# Patient Record
Sex: Male | Born: 2000 | Race: White | Hispanic: No | Marital: Single | Smoking: Former smoker
Health system: Southern US, Community
[De-identification: ages and names within clinical notes are randomized; demographics above are authoritative.]

## PROBLEM LIST (undated history)

## (undated) DIAGNOSIS — F419 Anxiety disorder, unspecified: Secondary | ICD-10-CM

## (undated) HISTORY — PX: APPENDECTOMY: SHX54

---
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
## 2022-07-27 DIAGNOSIS — Z20822 Contact with and (suspected) exposure to covid-19: Secondary | ICD-10-CM | POA: Diagnosis not present

## 2022-07-27 DIAGNOSIS — J069 Acute upper respiratory infection, unspecified: Secondary | ICD-10-CM | POA: Diagnosis not present

## 2022-07-27 DIAGNOSIS — J029 Acute pharyngitis, unspecified: Secondary | ICD-10-CM | POA: Diagnosis not present

## 2022-08-03 DIAGNOSIS — R059 Cough, unspecified: Secondary | ICD-10-CM | POA: Diagnosis not present

## 2022-08-03 DIAGNOSIS — J069 Acute upper respiratory infection, unspecified: Secondary | ICD-10-CM | POA: Diagnosis not present

## 2022-08-04 DIAGNOSIS — Z5321 Procedure and treatment not carried out due to patient leaving prior to being seen by health care provider: Secondary | ICD-10-CM | POA: Diagnosis not present

## 2022-08-04 DIAGNOSIS — R059 Cough, unspecified: Secondary | ICD-10-CM | POA: Diagnosis not present

## 2022-08-04 DIAGNOSIS — I4510 Unspecified right bundle-branch block: Secondary | ICD-10-CM | POA: Diagnosis not present

## 2022-08-05 DIAGNOSIS — B9689 Other specified bacterial agents as the cause of diseases classified elsewhere: Secondary | ICD-10-CM | POA: Diagnosis not present

## 2022-08-05 DIAGNOSIS — J208 Acute bronchitis due to other specified organisms: Secondary | ICD-10-CM | POA: Diagnosis not present

## 2022-08-05 DIAGNOSIS — R059 Cough, unspecified: Secondary | ICD-10-CM | POA: Diagnosis not present

## 2022-08-31 DIAGNOSIS — S51801A Unspecified open wound of right forearm, initial encounter: Secondary | ICD-10-CM | POA: Diagnosis not present

## 2022-08-31 DIAGNOSIS — J069 Acute upper respiratory infection, unspecified: Secondary | ICD-10-CM | POA: Diagnosis not present

## 2022-09-03 DIAGNOSIS — J00 Acute nasopharyngitis [common cold]: Secondary | ICD-10-CM | POA: Diagnosis not present

## 2022-09-14 DIAGNOSIS — Z20822 Contact with and (suspected) exposure to covid-19: Secondary | ICD-10-CM | POA: Diagnosis not present

## 2022-09-14 DIAGNOSIS — J101 Influenza due to other identified influenza virus with other respiratory manifestations: Secondary | ICD-10-CM | POA: Diagnosis not present

## 2022-09-17 DIAGNOSIS — R059 Cough, unspecified: Secondary | ICD-10-CM | POA: Diagnosis not present

## 2022-09-17 DIAGNOSIS — J101 Influenza due to other identified influenza virus with other respiratory manifestations: Secondary | ICD-10-CM | POA: Diagnosis not present

## 2022-09-20 ENCOUNTER — Other Ambulatory Visit: Payer: Self-pay

## 2022-09-20 ENCOUNTER — Emergency Department (HOSPITAL_BASED_OUTPATIENT_CLINIC_OR_DEPARTMENT_OTHER): Payer: BC Managed Care – PPO | Admitting: Radiology

## 2022-09-20 ENCOUNTER — Other Ambulatory Visit (HOSPITAL_BASED_OUTPATIENT_CLINIC_OR_DEPARTMENT_OTHER): Payer: BC Managed Care – PPO | Admitting: Radiology

## 2022-09-20 ENCOUNTER — Encounter (HOSPITAL_COMMUNITY): Payer: Self-pay

## 2022-09-20 ENCOUNTER — Emergency Department (HOSPITAL_BASED_OUTPATIENT_CLINIC_OR_DEPARTMENT_OTHER): Payer: BC Managed Care – PPO

## 2022-09-20 ENCOUNTER — Emergency Department (HOSPITAL_BASED_OUTPATIENT_CLINIC_OR_DEPARTMENT_OTHER)
Admission: EM | Admit: 2022-09-20 | Discharge: 2022-09-20 | Disposition: A | Discharge status: Home / Self Care | Payer: BC Managed Care – PPO | Attending: Emergency Medicine | Admitting: Emergency Medicine

## 2022-09-20 DIAGNOSIS — R9431 Abnormal electrocardiogram [ECG] [EKG]: Secondary | ICD-10-CM | POA: Diagnosis not present

## 2022-09-20 DIAGNOSIS — R1084 Generalized abdominal pain: Secondary | ICD-10-CM | POA: Insufficient documentation

## 2022-09-20 DIAGNOSIS — R1110 Vomiting, unspecified: Secondary | ICD-10-CM | POA: Diagnosis not present

## 2022-09-20 DIAGNOSIS — D649 Anemia, unspecified: Secondary | ICD-10-CM | POA: Insufficient documentation

## 2022-09-20 DIAGNOSIS — R509 Fever, unspecified: Secondary | ICD-10-CM | POA: Diagnosis not present

## 2022-09-20 DIAGNOSIS — J020 Streptococcal pharyngitis: Secondary | ICD-10-CM | POA: Insufficient documentation

## 2022-09-20 DIAGNOSIS — R7401 Elevation of levels of liver transaminase levels: Secondary | ICD-10-CM | POA: Diagnosis not present

## 2022-09-20 DIAGNOSIS — R109 Unspecified abdominal pain: Secondary | ICD-10-CM | POA: Diagnosis not present

## 2022-09-20 DIAGNOSIS — R059 Cough, unspecified: Secondary | ICD-10-CM | POA: Diagnosis not present

## 2022-09-20 DIAGNOSIS — Z20822 Contact with and (suspected) exposure to covid-19: Secondary | ICD-10-CM | POA: Insufficient documentation

## 2022-09-20 LAB — URINALYSIS, ROUTINE W REFLEX MICROSCOPIC
Bilirubin Urine: NEGATIVE
Glucose, UA: NEGATIVE mg/dL
Hgb urine dipstick: NEGATIVE
Ketones, ur: NEGATIVE mg/dL
Leukocytes,Ua: NEGATIVE
Nitrite: NEGATIVE
Protein, ur: 30 mg/dL — AB
Specific Gravity, Urine: 1.025 (ref 1.005–1.030)
pH: 6 (ref 5.0–8.0)

## 2022-09-20 LAB — CBC WITH DIFFERENTIAL/PLATELET
Abs Immature Granulocytes: 0.02 10*3/uL (ref 0.00–0.07)
Basophils Absolute: 0 10*3/uL (ref 0.0–0.1)
Basophils Relative: 1 %
Eosinophils Absolute: 0 10*3/uL (ref 0.0–0.5)
Eosinophils Relative: 0 %
HCT: 37 % — ABNORMAL LOW (ref 39.0–52.0)
Hemoglobin: 12.9 g/dL — ABNORMAL LOW (ref 13.0–17.0)
Immature Granulocytes: 1 %
Lymphocytes Relative: 31 %
Lymphs Abs: 1.3 10*3/uL (ref 0.7–4.0)
MCH: 33.1 pg (ref 26.0–34.0)
MCHC: 34.9 g/dL (ref 30.0–36.0)
MCV: 94.9 fL (ref 80.0–100.0)
Monocytes Absolute: 0.4 10*3/uL (ref 0.1–1.0)
Monocytes Relative: 9 %
Neutro Abs: 2.5 10*3/uL (ref 1.7–7.7)
Neutrophils Relative %: 58 %
Platelets: 205 10*3/uL (ref 150–400)
RBC: 3.9 MIL/uL — ABNORMAL LOW (ref 4.22–5.81)
RDW: 12.2 % (ref 11.5–15.5)
Smear Review: NORMAL
WBC: 4.3 10*3/uL (ref 4.0–10.5)
nRBC: 0 % (ref 0.0–0.2)

## 2022-09-20 LAB — COMPREHENSIVE METABOLIC PANEL
ALT: 129 U/L — ABNORMAL HIGH (ref 0–44)
AST: 108 U/L — ABNORMAL HIGH (ref 15–41)
Albumin: 3.9 g/dL (ref 3.5–5.0)
Alkaline Phosphatase: 53 U/L (ref 38–126)
Anion gap: 8 (ref 5–15)
BUN: 11 mg/dL (ref 6–20)
CO2: 27 mmol/L (ref 22–32)
Calcium: 9 mg/dL (ref 8.9–10.3)
Chloride: 96 mmol/L — ABNORMAL LOW (ref 98–111)
Creatinine, Ser: 0.85 mg/dL (ref 0.61–1.24)
GFR, Estimated: >60 mL/min (ref >60)
Glucose, Bld: 90 mg/dL (ref 70–99)
Potassium: 4.3 mmol/L (ref 3.5–5.1)
Sodium: 131 mmol/L — ABNORMAL LOW (ref 135–145)
Total Bilirubin: 0.2 mg/dL — ABNORMAL LOW (ref 0.3–1.2)
Total Protein: 7.2 g/dL (ref 6.5–8.1)

## 2022-09-20 LAB — RAPID HIV SCREEN (HIV 1/2 AB+AG)
HIV 1/2 Antibodies: NONREACTIVE
HIV-1 P24 Antigen - HIV24: NONREACTIVE

## 2022-09-20 LAB — LIPASE, BLOOD: Lipase: 16 U/L (ref 11–51)

## 2022-09-20 LAB — MONONUCLEOSIS SCREEN: Mono Screen: NEGATIVE

## 2022-09-20 LAB — SARS CORONAVIRUS 2 BY RT PCR: SARS Coronavirus 2 by RT PCR: NEGATIVE

## 2022-09-20 LAB — GROUP A STREP BY PCR: Group A Strep by PCR: DETECTED — AB

## 2022-09-20 MED ORDER — IOHEXOL 300 MG/ML  SOLN
100.0000 mL | Freq: Once | INTRAMUSCULAR | Status: AC | PRN
  Administered 2022-09-20: 80 mL via INTRAVENOUS

## 2022-09-20 MED ORDER — SODIUM CHLORIDE 0.9 % IV BOLUS
1000.0000 mL | Freq: Once | INTRAVENOUS | Status: AC
Start: 2022-09-20 — End: 2022-09-20
  Administered 2022-09-20: 1000 mL via INTRAVENOUS

## 2022-09-20 MED ORDER — PENICILLIN G BENZATHINE 1200000 UNIT/2ML IM SUSY
1.2000 10*6.[IU] | PREFILLED_SYRINGE | Freq: Once | INTRAMUSCULAR | Status: AC
  Administered 2022-09-20: 1.2 10*6.[IU] via INTRAMUSCULAR
  Filled 2022-09-20: qty 2

## 2022-09-20 MED ORDER — IBUPROFEN 800 MG PO TABS
800.0000 mg | ORAL_TABLET | Freq: Once | ORAL | Status: AC
  Administered 2022-09-20: 800 mg via ORAL
  Filled 2022-09-20: qty 1

## 2022-09-20 NOTE — ED Triage Notes (Signed)
Patient here POV from Home.  Endorses having Multiple URI Symptoms such as Fever, Fatigue, Productive Cough, Aches, Emesis, Intermittent Sore Throat.   This has been ongoing Intermittently for 2 Months. Has visited several different providers regarding ailments. Diagnosed with Flu 4-5 Days ago. Has been taking Antibiotics for Possible Bronchitis.  NAD Noted during Triage. A&Ox4. GCS 15. Ambulatory.

## 2022-09-20 NOTE — Discharge Instructions (Addendum)
Thank you for allowing me to be a part of your care today.  You were evaluated in the ER for continued high fevers and sore throat.  You are strep positive.  We gave you a shot of penicillin while you were here.  Please continue to take your Augmentin and take it to completion.  Laboratory work-up is reassuring.  You do not require emergent intervention or hospitalization at this time.  You did have laboratory findings that are consistent with mono, despite your Monospot being negative.  Your hepatitis panel and EBV panel are still pending.  You will be notified of these results when they become available.  You may continue to alternate taking Tylenol and ibuprofen every 3-4 hours as needed for fever control.  If you develop fever that is not responsive to treatment, please return to the ER for further evaluation and management.  Please follow-up with your primary care provider later this week for reevaluation of your symptoms and if you continue to have respiratory or other infections.  Return to the ER if you develop new or worsening symptoms.

## 2022-09-20 NOTE — ED Provider Notes (Cosign Needed Addendum)
[facility] EMERGENCY DEPT Provider Note   CSN: [id number] Arrival date & time: 09/20/22  1214     History  Chief Complaint  Patient presents with   Fever    Seth Molina is a 21 y.o. male presents to the ER with complaint of fever, fatigue, productive cough, body aches, emesis, intermittent sore throat.  Patient has had multiple URI symptoms and fevers since the beginning of October.  He has been evaluated multiple times in urgent care and treated with different antibiotics.  He tested positive for influenza type B 4-5 days ago.  He was also placed on Augmentin for suspected bronchitis.  His fever is responsive to antipyretics.  He also reports some abdominal pain.  Denies shortness of breath, chest pain, trouble swallowing, diarrhea, weakness, syncope, lightheadedness, dysuria, rash.        Home Medications Prior to Admission medications   Medication Sig Start Date End Date Taking? Authorizing Provider  acetaminophen (TYLENOL) 325 MG tablet Take 2 tablets (650 mg total) by mouth every 6 (six) hours. Patient taking differently: Take 650 mg by mouth every 6 (six) hours as needed for mild pain or headache. 01/20/19   Marrion Coy, MD  diphenhydrAMINE (BENADRYL) 25 mg capsule Take 25 mg by mouth every 6 (six) hours as needed (for restless legs).     [provider]  FLUoxetine (PROZAC) 20 MG capsule Take 20 mg by mouth at bedtime.  05/09/20   [provider]  hydrOXYzine (ATARAX/VISTARIL) 25 MG tablet Take 1 tablet (25 mg total) by mouth 2 (two) times daily as needed for anxiety. 01/20/19   Marrion Coy, MD  ibuprofen (ADVIL,MOTRIN) 200 MG tablet Take 400 mg by mouth every 6 (six) hours as needed for headache or mild pain.     [provider]  ISOtretinoin (ACCUTANE PO) Take by mouth.    [provider]  ketoconazole (NIZORAL) 2 % shampoo Apply 1 application topically 2 (two) times a week.    [provider]  mupirocin ointment  (BACTROBAN) 2 % Apply 1 application topically See admin instructions. Apply to affected areas 2-3 times as directed 05/10/20   [provider]  simethicone (MYLICON) 40 MG/0.6ML drops Take 0.6 mLs (40 mg total) by mouth 2 (two) times daily as needed for flatulence. Patient not taking: Reported on 05/25/2020 01/20/19   Leeroy Bock, MD  simethicone (MYLICON) 80 MG chewable tablet Chew 80-160 mg by mouth every 6 (six) hours as needed for flatulence.    [provider]      Allergies    Azithromycin and Tylenol [acetaminophen]    Review of Systems   Review of Systems  Constitutional:  Positive for appetite change, chills and fever.  HENT:  Positive for sore throat. Negative for trouble swallowing.   Respiratory:  Positive for cough. Negative for shortness of breath.   Cardiovascular:  Negative for chest pain.  Gastrointestinal:  Positive for abdominal pain, nausea and vomiting. Negative for diarrhea.  Genitourinary:  Negative for dysuria.  Musculoskeletal:  Positive for myalgias.  Skin:  Negative for rash.  Neurological:  Negative for dizziness, syncope, weakness and light-headedness.    Physical Exam Updated Vital Signs BP 97/61   Pulse 89   Temp 100.3 F (37.9 C) (Oral)   Resp (!) 21   Ht 5\' 9"  (1.753 m)   Wt 62.4 kg   SpO2 96%   BMI 20.32 kg/m  Physical Exam Vitals and nursing note reviewed.  Constitutional:  General: He is not in acute distress.    Appearance: He is not ill-appearing.  HENT:     Mouth/Throat:     Mouth: Mucous membranes are moist.     Pharynx: Pharyngeal swelling and posterior oropharyngeal erythema present. No uvula swelling.  Cardiovascular:     Rate and Rhythm: Normal rate and regular rhythm.     Pulses: Normal pulses.     Heart sounds: Normal heart sounds.  Pulmonary:     Effort: Pulmonary effort is normal. No respiratory distress.     Breath sounds: Normal breath sounds and air entry. No wheezing or rhonchi.  Abdominal:      General: Abdomen is flat. Bowel sounds are normal. There is no distension.     Palpations: Abdomen is soft.     Tenderness: There is generalized abdominal tenderness.     Comments: Generalized abdominal tenderness with increased tenderness in the epigastric and right upper quadrant.  Lymphadenopathy:     Cervical: No cervical adenopathy.  Skin:    General: Skin is warm and dry.     Capillary Refill: Capillary refill takes less than 2 seconds.     Coloration: Skin is pale.  Neurological:     Mental Status: He is alert. Mental status is at baseline.  Psychiatric:        Mood and Affect: Mood normal.        Behavior: Behavior normal.     ED Results / Procedures / Treatments   Labs (all labs ordered are listed, but only abnormal results are displayed) Labs Reviewed  GROUP A STREP BY PCR - Abnormal; Notable for the following components:      Result Value   Group A Strep by PCR DETECTED (*)    All other components within normal limits  CBC WITH DIFFERENTIAL/PLATELET - Abnormal; Notable for the following components:   RBC 3.90 (*)    Hemoglobin 12.9 (*)    HCT 37.0 (*)    All other components within normal limits  COMPREHENSIVE METABOLIC PANEL - Abnormal; Notable for the following components:   Sodium 131 (*)    Chloride 96 (*)    AST 108 (*)    ALT 129 (*)    Total Bilirubin 0.2 (*)    All other components within normal limits  URINALYSIS, ROUTINE W REFLEX MICROSCOPIC - Abnormal; Notable for the following components:   Protein, ur 30 (*)    All other components within normal limits  SARS CORONAVIRUS 2 BY RT PCR  MONONUCLEOSIS SCREEN  LIPASE, BLOOD  RAPID HIV SCREEN (HIV 1/2 AB+AG)  EPSTEIN-BARR VIRUS (EBV) ANTIBODY PROFILE  HEPATITIS PANEL, ACUTE    EKG EKG Interpretation  Date/Time:  Sunday September 20 2022 15:47:29 EST Ventricular Rate:  86 PR Interval:  134 QRS Duration: 108 QT Interval:  343 QTC Calculation: 411 R Axis:   81 Text Interpretation: Sinus  rhythm Probable left ventricular hypertrophy No significant change was found Confirmed by Glynn Octave [id number]) on 09/20/2022 6:54:13 PM  Radiology DG Chest 2 View  Result Date: 09/20/2022 CLINICAL DATA:  Persistent cough. EXAM: CHEST - 2 VIEW COMPARISON:  None Available. FINDINGS: Normal mediastinum and cardiac silhouette. Normal pulmonary vasculature. No evidence of effusion, infiltrate, or pneumothorax. No acute bony abnormality. IMPRESSION: No acute cardiopulmonary process. Electronically Signed   By: Genevive Bi M.D.   On: 09/20/2022 17:17    Procedures Procedures    Medications Ordered in ED Medications  sodium chloride 0.9 % bolus 1,000 mL (1,000 mLs Intravenous  New Bag/Given 09/20/22 1750)  ibuprofen (ADVIL) tablet 800 mg (800 mg Oral Given 09/20/22 1856)  iohexol (OMNIPAQUE) 300 MG/ML solution 100 mL (80 mLs Intravenous Contrast Given 09/20/22 2015)  penicillin g benzathine (BICILLIN LA) 1200000 UNIT/2ML injection 1.2 Million Units (1.2 Million Units Intramuscular Given 09/20/22 2140)    ED Course/ Medical Decision Making/ A&P                           Medical Decision Making Amount and/or Complexity of Data Reviewed Labs: ordered. Radiology: ordered.  Risk Prescription drug management.   This patient presents to the ED with chief complaint(s) of URI symptoms, sore throat, and persistent high fevers with pertinent past medical history of same symptoms since 08/03/2022 .The complaint involves an extensive differential diagnosis and also carries with it a high risk of complications and morbidity.    The differential diagnosis includes COVID, strep pharyngitis, EBV, acute bronchitis, pneumonia  The initial plan is to obtain baseline labs including CBC, CMP, swab for COVID, strep swab, UA, mono, lipase  Additional history obtained: Additional history obtained from family, patient's mother is with him at bedside Records reviewed Primary Care Documents  Initial  Assessment:   On exam, patient appears ill and is pale.  Skin is warm and dry, nondiaphoretic.  Heart rate and rhythm are normal.  Lungs are clear to auscultation bilaterally.  He does have a dry nonproductive cough.  Abdomen is soft and generally tender.  Posterior oropharynx is erythematous with mild swelling, no obvious exudate.  No appreciable cervical lymphadenopathy.  He is able to swallow and speak without difficulty.  Independent ECG/labs interpretation:  The following labs were independently interpreted:  CBC reveals borderline anemia, no leukocytosis.  He does have occasional atypical lymphocytes seen. Metabolic panel significant for elevated AST and ALT.  Sodium and chloride abnormal. Mononuclear screen was negative. UA is otherwise unremarkable, some protein, no evidence of UTI, glucosuria. COVID was negative Group A strep was positive Lipase normal  Additional testing was ordered including hepatitis panel, EBV antibody profile, HIV screen.   Independent visualization and interpretation of imaging: I independently visualized the following imaging with scope of interpretation limited to determining acute life threatening conditions related to emergency care: Chest x-ray, which revealed active cardiopulmonary disease.  I agree with radiologist interpretation.  Based on patient's vague abdominal pain, will obtain CT abdomen pelvis with contrast.  CT abdomen pelvis revealed a Extina millimeter radiopaque object in the ileus.  Likely to be an incidental finding.  Scan is otherwise unremarkable.  Treatment and Reassessment: We will treat patient with IV fluids and manage his fever with ibuprofen.  He reports feeling somewhat better following treatment.  His fever is being well managed and defervesced.  He continued to have strep symptoms despite being on Augmentin.  We will give him a Bicillin shot and have him continue taking his Augmentin at home.  Consulted with general surgery, Ivar Drape, regarding CT findings.  There is no evidence of perforation or inflammation to the intestine where the radiopaque object was found.  This is likely to be an incidental finding and object will likely pass on its own.  They state that it is likely unrelated to his fevers and presentation in ER today.  He is tolerating eating and drinking without nausea, vomiting, or worsening of symptoms.  Disposition:   At this time, patient does not have any condition that requires hospitalization or emergent intervention.  He  has improved with IV fluid treatment and his fever is responsive to ibuprofen.  He does not have significant leukocytosis and is not septic.  Discussed laboratory work-up and findings with patient and mother at bedside.  He does not recall any instance where he would have ingested a foreign object.  He is not complaining of significant right lower quadrant abdominal pain and has been having bowel movements without issue.  Recommended patient follow-up with primary care later this week for reevaluation of his symptoms.  Advised to continue to treat fevers with Advil and Tylenol alternating every 3-4 hours.  Patient and mother verbally expressed their understanding and agree with plan of care and discharge home.  Return precautions were given.          Final Clinical Impression(s) / ED Diagnoses Final diagnoses:  Strep pharyngitis    Rx / DC Orders ED Discharge Orders     None         Lenard SimmerClark, Yona Stansbury R, PA 09/20/22 2312    Lenard SimmerClark, Joeangel Jeanpaul R, [place] 09/20/22 2314    Glynn Octaveancour, Stephen, MD 09/22/22 0111

## 2022-09-21 LAB — HEPATITIS PANEL, ACUTE
HCV Ab: NONREACTIVE
Hep A IgM: NONREACTIVE
Hep B C IgM: NONREACTIVE
Hepatitis B Surface Ag: NONREACTIVE

## 2022-09-22 LAB — EPSTEIN-BARR VIRUS (EBV) ANTIBODY PROFILE
EBV NA IgG: <18 U/mL (ref 0.0–17.9)
EBV VCA IgG: <18 U/mL (ref 0.0–17.9)
EBV VCA IgM: <36 U/mL (ref 0.0–35.9)

## 2022-09-25 DIAGNOSIS — Z1321 Encounter for screening for nutritional disorder: Secondary | ICD-10-CM | POA: Diagnosis not present

## 2022-09-25 DIAGNOSIS — B349 Viral infection, unspecified: Secondary | ICD-10-CM | POA: Diagnosis not present

## 2022-09-25 DIAGNOSIS — J020 Streptococcal pharyngitis: Secondary | ICD-10-CM | POA: Diagnosis not present

## 2022-09-25 DIAGNOSIS — R7989 Other specified abnormal findings of blood chemistry: Secondary | ICD-10-CM | POA: Diagnosis not present

## 2022-09-25 DIAGNOSIS — R051 Acute cough: Secondary | ICD-10-CM | POA: Diagnosis not present

## 2022-10-02 DIAGNOSIS — R748 Abnormal levels of other serum enzymes: Secondary | ICD-10-CM | POA: Diagnosis not present

## 2022-10-09 DIAGNOSIS — H5213 Myopia, bilateral: Secondary | ICD-10-CM | POA: Diagnosis not present

## 2022-10-22 DIAGNOSIS — R7989 Other specified abnormal findings of blood chemistry: Secondary | ICD-10-CM | POA: Diagnosis not present

## 2022-10-22 DIAGNOSIS — R509 Fever, unspecified: Secondary | ICD-10-CM | POA: Diagnosis not present

## 2022-11-16 DIAGNOSIS — R509 Fever, unspecified: Secondary | ICD-10-CM | POA: Diagnosis not present

## 2022-11-16 DIAGNOSIS — R7989 Other specified abnormal findings of blood chemistry: Secondary | ICD-10-CM | POA: Diagnosis not present

## 2022-12-01 DIAGNOSIS — R7989 Other specified abnormal findings of blood chemistry: Secondary | ICD-10-CM | POA: Diagnosis not present

## 2022-12-01 DIAGNOSIS — R590 Localized enlarged lymph nodes: Secondary | ICD-10-CM | POA: Diagnosis not present

## 2022-12-10 DIAGNOSIS — L608 Other nail disorders: Secondary | ICD-10-CM | POA: Diagnosis not present

## 2022-12-10 DIAGNOSIS — Z1330 Encounter for screening examination for mental health and behavioral disorders, unspecified: Secondary | ICD-10-CM | POA: Diagnosis not present

## 2022-12-31 DIAGNOSIS — E785 Hyperlipidemia, unspecified: Secondary | ICD-10-CM | POA: Diagnosis not present

## 2022-12-31 DIAGNOSIS — T5691XA Toxic effect of unspecified metal, accidental (unintentional), initial encounter: Secondary | ICD-10-CM | POA: Diagnosis not present

## 2022-12-31 DIAGNOSIS — Z0000 Encounter for general adult medical examination without abnormal findings: Secondary | ICD-10-CM | POA: Diagnosis not present

## 2022-12-31 DIAGNOSIS — R5382 Chronic fatigue, unspecified: Secondary | ICD-10-CM | POA: Diagnosis not present

## 2022-12-31 DIAGNOSIS — R739 Hyperglycemia, unspecified: Secondary | ICD-10-CM | POA: Diagnosis not present

## 2022-12-31 DIAGNOSIS — F411 Generalized anxiety disorder: Secondary | ICD-10-CM | POA: Diagnosis not present

## 2022-12-31 DIAGNOSIS — E559 Vitamin D deficiency, unspecified: Secondary | ICD-10-CM | POA: Diagnosis not present

## 2022-12-31 DIAGNOSIS — E611 Iron deficiency: Secondary | ICD-10-CM | POA: Diagnosis not present

## 2023-01-12 DIAGNOSIS — Z202 Contact with and (suspected) exposure to infections with a predominantly sexual mode of transmission: Secondary | ICD-10-CM | POA: Diagnosis not present

## 2023-01-30 DIAGNOSIS — R051 Acute cough: Secondary | ICD-10-CM | POA: Diagnosis not present

## 2023-01-30 DIAGNOSIS — R0981 Nasal congestion: Secondary | ICD-10-CM | POA: Diagnosis not present

## 2023-01-30 DIAGNOSIS — J00 Acute nasopharyngitis [common cold]: Secondary | ICD-10-CM | POA: Diagnosis not present

## 2023-01-30 DIAGNOSIS — J029 Acute pharyngitis, unspecified: Secondary | ICD-10-CM | POA: Diagnosis not present

## 2023-07-06 DIAGNOSIS — N5089 Other specified disorders of the male genital organs: Secondary | ICD-10-CM | POA: Diagnosis not present

## 2023-07-22 DIAGNOSIS — N509 Disorder of male genital organs, unspecified: Secondary | ICD-10-CM | POA: Diagnosis not present

## 2023-07-22 DIAGNOSIS — N433 Hydrocele, unspecified: Secondary | ICD-10-CM | POA: Diagnosis not present

## 2023-09-14 DIAGNOSIS — R0981 Nasal congestion: Secondary | ICD-10-CM | POA: Diagnosis not present

## 2023-09-14 DIAGNOSIS — J189 Pneumonia, unspecified organism: Secondary | ICD-10-CM | POA: Diagnosis not present

## 2023-10-02 DIAGNOSIS — R0989 Other specified symptoms and signs involving the circulatory and respiratory systems: Secondary | ICD-10-CM | POA: Diagnosis not present

## 2023-10-02 DIAGNOSIS — R059 Cough, unspecified: Secondary | ICD-10-CM | POA: Diagnosis not present

## 2023-10-02 DIAGNOSIS — J069 Acute upper respiratory infection, unspecified: Secondary | ICD-10-CM | POA: Diagnosis not present

## 2023-10-02 DIAGNOSIS — Z1152 Encounter for screening for COVID-19: Secondary | ICD-10-CM | POA: Diagnosis not present

## 2023-10-02 DIAGNOSIS — J029 Acute pharyngitis, unspecified: Secondary | ICD-10-CM | POA: Diagnosis not present

## 2023-10-05 DIAGNOSIS — B9689 Other specified bacterial agents as the cause of diseases classified elsewhere: Secondary | ICD-10-CM | POA: Diagnosis not present

## 2023-10-05 DIAGNOSIS — F411 Generalized anxiety disorder: Secondary | ICD-10-CM | POA: Diagnosis not present

## 2023-10-05 DIAGNOSIS — J019 Acute sinusitis, unspecified: Secondary | ICD-10-CM | POA: Diagnosis not present

## 2023-12-16 ENCOUNTER — Emergency Department (HOSPITAL_BASED_OUTPATIENT_CLINIC_OR_DEPARTMENT_OTHER): Payer: BC Managed Care – PPO

## 2023-12-16 ENCOUNTER — Other Ambulatory Visit: Payer: Self-pay

## 2023-12-16 ENCOUNTER — Emergency Department (HOSPITAL_BASED_OUTPATIENT_CLINIC_OR_DEPARTMENT_OTHER)
Admission: EM | Admit: 2023-12-16 | Discharge: 2023-12-16 | Disposition: A | Discharge status: Home / Self Care | Payer: BC Managed Care – PPO | Attending: Emergency Medicine | Admitting: Emergency Medicine

## 2023-12-16 ENCOUNTER — Emergency Department (HOSPITAL_BASED_OUTPATIENT_CLINIC_OR_DEPARTMENT_OTHER): Payer: BC Managed Care – PPO | Admitting: Radiology

## 2023-12-16 DIAGNOSIS — S0990XA Unspecified injury of head, initial encounter: Secondary | ICD-10-CM | POA: Insufficient documentation

## 2023-12-16 DIAGNOSIS — R0781 Pleurodynia: Secondary | ICD-10-CM | POA: Diagnosis not present

## 2023-12-16 DIAGNOSIS — S20211A Contusion of right front wall of thorax, initial encounter: Secondary | ICD-10-CM | POA: Diagnosis not present

## 2023-12-16 DIAGNOSIS — R079 Chest pain, unspecified: Secondary | ICD-10-CM | POA: Diagnosis not present

## 2023-12-16 DIAGNOSIS — S299XXA Unspecified injury of thorax, initial encounter: Secondary | ICD-10-CM | POA: Diagnosis not present
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
## 2024-01-07 DIAGNOSIS — E860 Dehydration: Secondary | ICD-10-CM | POA: Diagnosis not present

## 2024-01-07 DIAGNOSIS — F1010 Alcohol abuse, uncomplicated: Secondary | ICD-10-CM | POA: Diagnosis not present

## 2024-01-07 DIAGNOSIS — R112 Nausea with vomiting, unspecified: Secondary | ICD-10-CM | POA: Diagnosis not present

## 2024-01-07 DIAGNOSIS — Z5321 Procedure and treatment not carried out due to patient leaving prior to being seen by health care provider: Secondary | ICD-10-CM | POA: Diagnosis not present

## 2024-01-07 DIAGNOSIS — R824 Acetonuria: Secondary | ICD-10-CM | POA: Diagnosis not present

## 2024-04-17 ENCOUNTER — Ambulatory Visit (INDEPENDENT_AMBULATORY_CARE_PROVIDER_SITE_OTHER): Admitting: Urgent Care

## 2024-04-17 ENCOUNTER — Encounter: Payer: Self-pay | Admitting: Urgent Care

## 2024-04-17 VITALS — BP 117/79 | HR 70 | Temp 97.9°F | Resp 18 | Ht 69.0 in | Wt 137.5 lb

## 2024-04-17 DIAGNOSIS — Z0000 Encounter for general adult medical examination without abnormal findings: Secondary | ICD-10-CM

## 2024-04-17 NOTE — Progress Notes (Signed)
 Annual Wellness Visit     Patient: Seth Molina, Male    DOB: May 06, 2001, 23 y.o.   MRN: [id number]  Subjective  Chief Complaint  Patient presents with   New Patient (Initial Visit)    Est. 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 Doesn't have any paperwork    Seth Molina is a 23 y.o. male who presents today for his Annual Wellness Visit. He reports consuming a low sugar diet. Gym/ health club routine includes mod to heavy weightlifting. And treadmill. Exercises 5 days per week. He generally feels well. He reports sleeping well. He does not have additional problems to discuss today.   HPI  Vision:Within last year  Dentist - sees then twice a year.  Discussed the use of AI scribe software for clinical note transcription with the patient, who gave verbal consent to proceed.  History of Present Illness   Seth Molina is a 23 year old male who presents for an annual physical exam.  He takes fluoxetine 20 mg at night and hydroxyzine  25 mg twice daily as needed, with no current need for refills. He rates his overall health as a 'seven or eight' out of ten, primarily due to an inconsistent sleep schedule. During the summer, he averages eight hours of sleep per night, but during the school year, he only gets four to five hours. He does not have trouble falling asleep.  He exercises five times a week, primarily weightlifting at the gym, and also engages in cardiovascular activities such as using the treadmill machine. He follows a low-sugar diet for health reasons, without any specific dietary restrictions.  He has a history of tobacco use but no longer uses it. He follows up with both a dentist and an eye doctor, with his last eye visit being last summer and a dental visit a few months ago. He is currently in school with two years remaining and plans to stay in the area after graduation.        Medications: Outpatient Medications Prior to Visit   Medication Sig   FLUoxetine (PROZAC) 20 MG capsule Take 20 mg by mouth at bedtime.    hydrOXYzine  (ATARAX /VISTARIL ) 25 MG tablet Take 1 tablet (25 mg total) by mouth 2 (two) times daily as needed for anxiety.   [DISCONTINUED] acetaminophen  (TYLENOL ) 325 MG tablet Take 2 tablets (650 mg total) by mouth every 6 (six) hours. (Patient taking differently: Take 650 mg by mouth every 6 (six) hours as needed for mild pain (pain score 1-3) or headache.)   [DISCONTINUED] diphenhydrAMINE (BENADRYL) 25 mg capsule Take 25 mg by mouth every 6 (six) hours as needed (for restless legs).    [DISCONTINUED] ibuprofen  (ADVIL ,MOTRIN ) 200 MG tablet Take 400 mg by mouth every 6 (six) hours as needed for headache or mild pain.    [DISCONTINUED] ISOtretinoin (ACCUTANE PO) Take by mouth.   [DISCONTINUED] ketoconazole (NIZORAL) 2 % shampoo Apply 1 application topically 2 (two) times a week.   [DISCONTINUED] mupirocin ointment (BACTROBAN) 2 % Apply 1 application topically See admin instructions. Apply to affected areas 2-3 times as directed   [DISCONTINUED] simethicone  (MYLICON) 80 MG chewable tablet Chew 80-160 mg by mouth every 6 (six) hours as needed for flatulence.   [DISCONTINUED] simethicone  (MYLICON) 40 MG/0.6ML drops Take 0.6 mLs (40 mg total) by mouth 2 (two) times daily as needed for flatulence. (Patient not taking: Reported on 04/17/2024)   No facility-administered medications prior to visit.  Allergies  Allergen Reactions   Azithromycin Hives and Diarrhea   Tylenol  [Acetaminophen ]     On acutane    Patient Care Team: Emaline Handsome, MD as PCP - General (Family Medicine)  ROS Complete 12 point ROS performed with all pertinent positives listed in HPI      Objective  BP 117/79 (BP Location: Left Arm, Patient Position: Sitting, Cuff Size: Normal)   Pulse 70   Temp 97.9 F (36.6 C) (Oral)   Resp 18   Ht 5' 9 (1.753 m)   Wt 137 lb 8 oz (62.4 kg)   SpO2 100%   BMI 20.31 kg/m  BP Readings from  Last 3 Encounters:  04/17/24 117/79  12/16/23 117/65  09/20/22 (!) 105/96   Wt Readings from Last 3 Encounters:  04/17/24 137 lb 8 oz (62.4 kg)  12/16/23 135 lb (61.2 kg)  09/20/22 137 lb 9.1 oz (62.4 kg) (23%, Z= -0.75)*   * Growth percentiles are based on CDC (Boys, 2-20 Years) data.      Physical Exam Vitals and nursing note reviewed.  Constitutional:      General: He is not in acute distress.    Appearance: Normal appearance. He is not ill-appearing, toxic-appearing or diaphoretic.  HENT:     Head: Normocephalic and atraumatic.     Right Ear: Tympanic membrane, ear canal and external ear normal. There is no impacted cerumen.     Left Ear: Tympanic membrane, ear canal and external ear normal. There is no impacted cerumen.     Nose: Nose normal.     Mouth/Throat:     Mouth: Mucous membranes are moist.     Pharynx: Oropharynx is clear. No oropharyngeal exudate or posterior oropharyngeal erythema.   Eyes:     General: No scleral icterus.       Right eye: No discharge.        Left eye: No discharge.     Extraocular Movements: Extraocular movements intact.     Pupils: Pupils are equal, round, and reactive to light.   Neck:     Thyroid: No thyroid mass, thyromegaly or thyroid tenderness.   Cardiovascular:     Rate and Rhythm: Normal rate and regular rhythm.     Pulses: Normal pulses.     Heart sounds: No murmur heard. Pulmonary:     Effort: Pulmonary effort is normal. No respiratory distress.     Breath sounds: Normal breath sounds. No stridor. No wheezing or rhonchi.  Abdominal:     General: Abdomen is flat. Bowel sounds are normal. There is no distension.     Palpations: Abdomen is soft. There is no mass.     Tenderness: There is no abdominal tenderness. There is no guarding.   Musculoskeletal:        General: Tenderness present. No swelling. Normal range of motion.     Cervical back: Normal range of motion and neck supple. No rigidity or tenderness.     Right  lower leg: No edema.     Left lower leg: No edema.  Lymphadenopathy:     Cervical: No cervical adenopathy.   Skin:    General: Skin is warm and dry.     Coloration: Skin is not jaundiced.     Findings: No bruising, erythema or rash.   Neurological:     General: No focal deficit present.     Mental Status: He is alert and oriented to person, place, and time.     Sensory: No sensory  deficit.     Motor: No weakness.   Psychiatric:        Mood and Affect: Mood normal.        Behavior: Behavior normal.       Most recent functional status assessment:     No data to display         Most recent fall risk assessment:     No data to display          Most recent depression screenings:     No data to display         Most recent cognitive screening:     No data to display         Most recent Audit-C alcohol use screening     No data to display         A score of 3 or more in women, and 4 or more in men indicates increased risk for alcohol abuse, EXCEPT if all of the points are from question 1   Vision/Hearing Screen: No results found.  Last CBC Lab Results  Component Value Date   WBC 4.3 09/20/2022   HGB 12.9 (L) 09/20/2022   HCT 37.0 (L) 09/20/2022   MCV 94.9 09/20/2022   MCH 33.1 09/20/2022   RDW 12.2 09/20/2022   PLT 205 09/20/2022   Last metabolic panel Lab Results  Component Value Date   GLUCOSE 90 09/20/2022   NA 131 (L) 09/20/2022   K 4.3 09/20/2022   CL 96 (L) 09/20/2022   CO2 27 09/20/2022   BUN 11 09/20/2022   CREATININE 0.85 09/20/2022   GFRNONAA >60 09/20/2022   CALCIUM 9.0 09/20/2022   PHOS 4.5 01/19/2019   PROT 7.2 09/20/2022   ALBUMIN 3.9 09/20/2022   BILITOT 0.2 (L) 09/20/2022   ALKPHOS 53 09/20/2022   AST 108 (H) 09/20/2022   ALT 129 (H) 09/20/2022   ANIONGAP 8 09/20/2022      No results found for any visits on 04/17/24.    Assessment & Plan   Annual wellness visit done today including the all of the  following: Reviewed patient's Family Medical History Reviewed and updated list of patient's medical providers Assessment of cognitive impairment was done Assessed patient's functional ability Established a written schedule for health screening services Health Risk Assessent Completed and Reviewed  Exercise Activities and Dietary recommendations  Goals   None     Immunization History  Administered Date(s) Administered   Meningococcal B, Unspecified 05/09/2015, 06/24/2021   PFIZER(Purple Top)SARS-COV-2 Vaccination 02/05/2020, 03/04/2020   Tdap 05/09/2015    Health Maintenance  Topic Date Due   HPV VACCINES (1 - Male 3-dose series) Never done   Meningococcal B Vaccine (1 of 2 - Standard) 07/22/2021   COVID-19 Vaccine (3 - 2024-25 season) 07/04/2023   INFLUENZA VACCINE  06/02/2024   DTaP/Tdap/Td (2 - Td or Tdap) 05/08/2025   Hepatitis C Screening  Completed   HIV Screening  Completed     Discussed health benefits of physical activity, and encouraged him to engage in regular exercise appropriate for his age and condition.    Problem List Items Addressed This Visit   None Visit Diagnoses       Adult wellness visit    -  Primary   Relevant Orders   CBC with Differential/Platelet   Hemoglobin A1c   TSH   Lipid panel   Comprehensive metabolic panel with GFR      Assessment and Plan    General Health Maintenance 23 year old  male with inconsistent sleep patterns, regular exercise, low-sugar diet, and no tobacco use. Due for routine blood work and vaccinations. - Order routine blood work including thyroid and lipid panel testing. - Pt has completed meningitis and Dtap vaccine (due in 2026 for tdap) - Administer tetanus vaccine if due. - Pt needing to update HPV vaccine      No follow-ups on file.     Mandy Second, PA

## 2024-04-17 NOTE — Patient Instructions (Signed)
 We completed your annual physical today. Please continue your home medications as prescribed by your specialist.

## 2024-04-18 ENCOUNTER — Ambulatory Visit: Payer: Self-pay | Admitting: Urgent Care

## 2024-04-18 LAB — COMPREHENSIVE METABOLIC PANEL WITH GFR
ALT: 37 IU/L (ref 0–44)
AST: 22 IU/L (ref 0–40)
Albumin: 4.5 g/dL (ref 4.3–5.2)
Alkaline Phosphatase: 52 IU/L (ref 44–121)
BUN/Creatinine Ratio: 18 (ref 9–20)
BUN: 15 mg/dL (ref 6–20)
Bilirubin Total: 0.4 mg/dL (ref 0.0–1.2)
CO2: 23 mmol/L (ref 20–29)
Calcium: 10.1 mg/dL (ref 8.7–10.2)
Chloride: 101 mmol/L (ref 96–106)
Creatinine, Ser: 0.84 mg/dL (ref 0.76–1.27)
Globulin, Total: 2.6 g/dL (ref 1.5–4.5)
Glucose: 97 mg/dL (ref 70–99)
Potassium: 4.4 mmol/L (ref 3.5–5.2)
Sodium: 140 mmol/L (ref 134–144)
Total Protein: 7.1 g/dL (ref 6.0–8.5)
eGFR: 127 mL/min/{1.73_m2} (ref >59)

## 2024-04-18 LAB — CBC WITH DIFFERENTIAL/PLATELET
Basophils Absolute: 0.1 10*3/uL (ref 0.0–0.2)
Basos: 2 %
EOS (ABSOLUTE): 0.3 10*3/uL (ref 0.0–0.4)
Eos: 5 %
Hematocrit: 42.4 % (ref 37.5–51.0)
Hemoglobin: 14.1 g/dL (ref 13.0–17.7)
Immature Grans (Abs): 0 10*3/uL (ref 0.0–0.1)
Immature Granulocytes: 0 %
Lymphocytes Absolute: 3.9 10*3/uL — ABNORMAL HIGH (ref 0.7–3.1)
Lymphs: 51 %
MCH: 33 pg (ref 26.6–33.0)
MCHC: 33.3 g/dL (ref 31.5–35.7)
MCV: 99 fL — ABNORMAL HIGH (ref 79–97)
Monocytes Absolute: 0.5 10*3/uL (ref 0.1–0.9)
Monocytes: 7 %
Neutrophils Absolute: 2.6 10*3/uL (ref 1.4–7.0)
Neutrophils: 35 %
Platelets: 229 10*3/uL (ref 150–450)
RBC: 4.27 x10E6/uL (ref 4.14–5.80)
RDW: 11.9 % (ref 11.6–15.4)
WBC: 7.4 10*3/uL (ref 3.4–10.8)

## 2024-04-18 LAB — LIPID PANEL
Chol/HDL Ratio: 3.8 ratio (ref 0.0–5.0)
Cholesterol, Total: 196 mg/dL (ref 100–199)
HDL: 52 mg/dL (ref >39)
LDL Chol Calc (NIH): 126 mg/dL — ABNORMAL HIGH (ref 0–99)
Triglycerides: 99 mg/dL (ref 0–149)
VLDL Cholesterol Cal: 18 mg/dL (ref 5–40)

## 2024-04-18 LAB — HEMOGLOBIN A1C
Est. average glucose Bld gHb Est-mCnc: 91 mg/dL
Hgb A1c MFr Bld: 4.8 % (ref 4.8–5.6)

## 2024-04-18 LAB — TSH: TSH: 1.91 u[IU]/mL (ref 0.450–4.500)

## 2024-05-02 ENCOUNTER — Other Ambulatory Visit: Payer: Self-pay | Admitting: Family Medicine

## 2024-05-02 ENCOUNTER — Ambulatory Visit: Payer: Self-pay

## 2024-05-02 DIAGNOSIS — Z021 Encounter for pre-employment examination: Secondary | ICD-10-CM

## 2024-05-22 DIAGNOSIS — D2261 Melanocytic nevi of right upper limb, including shoulder: Secondary | ICD-10-CM | POA: Diagnosis not present

## 2024-06-15 DIAGNOSIS — Z1330 Encounter for screening examination for mental health and behavioral disorders, unspecified: Secondary | ICD-10-CM | POA: Diagnosis not present

## 2024-06-15 DIAGNOSIS — J3489 Other specified disorders of nose and nasal sinuses: Secondary | ICD-10-CM | POA: Diagnosis not present

## 2024-06-15 DIAGNOSIS — R059 Cough, unspecified: Secondary | ICD-10-CM | POA: Diagnosis not present

## 2024-06-15 DIAGNOSIS — U071 COVID-19: Secondary | ICD-10-CM | POA: Diagnosis not present
